# Patient Record
Sex: Female | Born: 1990 | Race: Black or African American | Hispanic: No | Marital: Single | Smoking: Never smoker
Health system: Southern US, Community
[De-identification: ages and names within clinical notes are randomized; demographics above are authoritative.]

## PROBLEM LIST (undated history)

## (undated) DIAGNOSIS — D649 Anemia, unspecified: Secondary | ICD-10-CM

## (undated) DIAGNOSIS — J45909 Unspecified asthma, uncomplicated: Secondary | ICD-10-CM

## (undated) DIAGNOSIS — D571 Sickle-cell disease without crisis: Secondary | ICD-10-CM

## (undated) DIAGNOSIS — I8390 Asymptomatic varicose veins of unspecified lower extremity: Secondary | ICD-10-CM

## (undated) HISTORY — DX: Unspecified asthma, uncomplicated: J45.909

## (undated) HISTORY — DX: Sickle-cell disease without crisis: D57.1

## (undated) HISTORY — DX: Anemia, unspecified: D64.9

## (undated) HISTORY — DX: Asymptomatic varicose veins of unspecified lower extremity: I83.90

## (undated) HISTORY — PX: INDUCED ABORTION: SHX677

---
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
## 2011-04-03 DIAGNOSIS — IMO0002 Reserved for concepts with insufficient information to code with codable children: Secondary | ICD-10-CM | POA: Insufficient documentation

## 2013-04-25 DIAGNOSIS — D582 Other hemoglobinopathies: Secondary | ICD-10-CM | POA: Insufficient documentation

## 2013-04-25 DIAGNOSIS — M21619 Bunion of unspecified foot: Secondary | ICD-10-CM | POA: Insufficient documentation

## 2013-06-07 DIAGNOSIS — Z975 Presence of (intrauterine) contraceptive device: Secondary | ICD-10-CM | POA: Insufficient documentation

## 2019-10-17 ENCOUNTER — Ambulatory Visit (LOCAL_COMMUNITY_HEALTH_CENTER): Payer: Medicaid Other | Admitting: Advanced Practice Midwife

## 2019-10-17 ENCOUNTER — Other Ambulatory Visit: Payer: Self-pay

## 2019-10-17 ENCOUNTER — Encounter: Payer: Self-pay | Admitting: Advanced Practice Midwife

## 2019-10-17 ENCOUNTER — Ambulatory Visit: Payer: Self-pay

## 2019-10-17 DIAGNOSIS — Z23 Encounter for immunization: Secondary | ICD-10-CM

## 2019-10-17 DIAGNOSIS — F339 Major depressive disorder, recurrent, unspecified: Secondary | ICD-10-CM | POA: Insufficient documentation

## 2019-10-17 DIAGNOSIS — T7412XS Child physical abuse, confirmed, sequela: Secondary | ICD-10-CM

## 2019-10-17 DIAGNOSIS — T7412XA Child physical abuse, confirmed, initial encounter: Secondary | ICD-10-CM | POA: Insufficient documentation

## 2019-10-17 DIAGNOSIS — Z113 Encounter for screening for infections with a predominantly sexual mode of transmission: Secondary | ICD-10-CM

## 2019-10-17 HISTORY — DX: Child physical abuse, confirmed, initial encounter: T74.12XA

## 2019-10-17 LAB — WET PREP FOR TRICH, YEAST, CLUE
Trichomonas Exam: NEGATIVE
Yeast Exam: NEGATIVE

## 2019-10-17 NOTE — Progress Notes (Signed)
Tdap adm. L. Delt-well tolerated; see FP visit Debera Lat, RN

## 2019-10-17 NOTE — Progress Notes (Signed)
In for screening due to vaginal itching x 5 days; desires HIV/RPR testing Debera Lat, RN  Wet prep reviewed-no Tx indicated; Tdap today Debera Lat, RN

## 2019-10-17 NOTE — Progress Notes (Signed)
[facility] Department STI clinic/screening visit  Subjective:  Beverly Dorsey is a 29 y.o.SBF G4P2 nonsmoker female being seen today for an STI screening visit. The patient reports they do have symptoms.  Patient reports that they do not desire a pregnancy in the next year.   They reported they are not interested in discussing contraception today.  No LMP recorded. (Menstrual status: IUD).   Patient has the following medical conditions:  There are no problems to display for this patient.   Chief Complaint  Patient presents with  . SEXUALLY TRANSMITTED DISEASE    HPI  Patient reports internal vaginal itching x 5 days.  LMP 8 years ago.  Last sex 10/12/19 without condom.  Mirena inserted 05/2017  See flowsheet for further details and programmatic requirements.    The following portions of the patient's history were reviewed and updated as appropriate: allergies, current medications, past medical history, past social history, past surgical history and problem list.  Objective:  There were no vitals filed for this visit.  Physical Exam Vitals and nursing note reviewed.  Constitutional:      Appearance: Normal appearance.  HENT:     Head: Normocephalic and atraumatic.     Mouth/Throat:     Mouth: Mucous membranes are moist.     Pharynx: Oropharynx is clear. No oropharyngeal exudate or posterior oropharyngeal erythema.  Eyes:     Conjunctiva/sclera: Conjunctivae normal.  Pulmonary:     Effort: Pulmonary effort is normal.  Abdominal:     Palpations: Abdomen is soft. There is no mass.     Tenderness: There is no abdominal tenderness. There is no rebound.  Genitourinary:    General: Normal vulva.     Exam position: Lithotomy position.     Pubic Area: No rash or pubic lice.      Labia:        Right: No rash or lesion.        Left: No rash or lesion.      Vagina: Vaginal discharge (white creamy moderate leukorrhea, ph>4.5) present. No erythema, bleeding or  lesions.     Cervix: Normal.     Uterus: Normal.      Adnexa: Right adnexa normal and left adnexa normal.     Rectum: Normal.  Musculoskeletal:     Cervical back: Neck supple.  Lymphadenopathy:     Head:     Right side of head: No preauricular or posterior auricular adenopathy.     Left side of head: No preauricular or posterior auricular adenopathy.     Cervical: No cervical adenopathy.     Upper Body:     Right upper body: No supraclavicular or axillary adenopathy.     Left upper body: No supraclavicular or axillary adenopathy.     Lower Body: No right inguinal adenopathy. No left inguinal adenopathy.  Skin:    General: Skin is warm and dry.     Findings: No rash.  Neurological:     Mental Status: She is alert and oriented to person, place, and time.      Assessment and Plan:  Beverly Dorsey is a 29 y.o. female presenting to the [facility] Department for STI screening  1. Screening examination for venereal disease Treat wet mount per standing orders Immunization nurse consult - HIV [place] [facility] - Syphilis Serology, [place] [facility] - Chlamydia/Gonorrhea  Lab - WET PREP FOR [place], YEAST, CLUE - Gonococcus culture     Return if symptoms worsen or  fail to improve.  No future appointments.  Herbie Saxon, CNM

## 2019-10-21 LAB — GONOCOCCUS CULTURE

## 2020-02-29 ENCOUNTER — Encounter: Payer: Self-pay | Admitting: Emergency Medicine

## 2020-02-29 ENCOUNTER — Emergency Department: Payer: Medicaid Other

## 2020-02-29 ENCOUNTER — Other Ambulatory Visit: Payer: Self-pay

## 2020-02-29 ENCOUNTER — Emergency Department
Admission: EM | Admit: 2020-02-29 | Discharge: 2020-02-29 | Disposition: A | Discharge status: Home / Self Care | Payer: Medicaid Other | Attending: Emergency Medicine | Admitting: Emergency Medicine

## 2020-02-29 DIAGNOSIS — N611 Abscess of the breast and nipple: Secondary | ICD-10-CM | POA: Diagnosis not present

## 2020-02-29 DIAGNOSIS — N644 Mastodynia: Secondary | ICD-10-CM | POA: Diagnosis present

## 2020-02-29 DIAGNOSIS — L0291 Cutaneous abscess, unspecified: Secondary | ICD-10-CM

## 2020-02-29 DIAGNOSIS — N63 Unspecified lump in unspecified breast: Secondary | ICD-10-CM | POA: Insufficient documentation

## 2020-02-29 NOTE — ED Triage Notes (Signed)
States she felt a knot to right breast area
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
## 2020-03-06 ENCOUNTER — Other Ambulatory Visit: Payer: Self-pay

## 2020-03-06 DIAGNOSIS — N6315 Unspecified lump in the right breast, overlapping quadrants: Secondary | ICD-10-CM

## 2020-03-09 ENCOUNTER — Other Ambulatory Visit: Payer: Medicaid Other

## 2020-03-16 ENCOUNTER — Ambulatory Visit
Admission: RE | Admit: 2020-03-16 | Discharge: 2020-03-16 | Disposition: A | Discharge status: Home / Self Care | Payer: Medicaid Other | Source: Ambulatory Visit

## 2020-03-16 ENCOUNTER — Other Ambulatory Visit: Payer: Self-pay

## 2020-03-16 DIAGNOSIS — N6315 Unspecified lump in the right breast, overlapping quadrants: Secondary | ICD-10-CM | POA: Insufficient documentation

## 2020-03-20 ENCOUNTER — Other Ambulatory Visit: Payer: Self-pay

## 2020-03-20 DIAGNOSIS — N6310 Unspecified lump in the right breast, unspecified quadrant: Secondary | ICD-10-CM

## 2020-03-20 DIAGNOSIS — R928 Other abnormal and inconclusive findings on diagnostic imaging of breast: Secondary | ICD-10-CM

## 2020-03-22 ENCOUNTER — Ambulatory Visit
Admission: RE | Admit: 2020-03-22 | Discharge: 2020-03-22 | Disposition: A | Discharge status: Home / Self Care | Payer: Medicaid Other | Source: Ambulatory Visit

## 2020-03-22 ENCOUNTER — Other Ambulatory Visit: Payer: Self-pay

## 2020-03-22 DIAGNOSIS — N6310 Unspecified lump in the right breast, unspecified quadrant: Secondary | ICD-10-CM

## 2020-03-22 DIAGNOSIS — R928 Other abnormal and inconclusive findings on diagnostic imaging of breast: Secondary | ICD-10-CM | POA: Diagnosis present

## 2020-03-23 LAB — SURGICAL PATHOLOGY

## 2020-03-26 ENCOUNTER — Telehealth: Payer: Self-pay

## 2020-03-26 NOTE — Telephone Encounter (Signed)
Beverly Dorsey has an appt with Dr. Celine Ahr on 05/01/2020 at 9:00 am.  Patient requested appt be set out one month.  Patient is aware of time/date.

## 2020-05-01 ENCOUNTER — Encounter: Payer: Self-pay | Admitting: General Surgery

## 2020-05-01 ENCOUNTER — Other Ambulatory Visit: Payer: Self-pay

## 2020-05-01 ENCOUNTER — Ambulatory Visit (INDEPENDENT_AMBULATORY_CARE_PROVIDER_SITE_OTHER): Payer: Medicaid Other | Admitting: General Surgery

## 2020-05-01 VITALS — BP 121/87 | HR 87 | Temp 98.3°F | Ht 64.0 in | Wt 160.6 lb

## 2020-05-01 DIAGNOSIS — D249 Benign neoplasm of unspecified breast: Secondary | ICD-10-CM

## 2020-05-01 DIAGNOSIS — M2010 Hallux valgus (acquired), unspecified foot: Secondary | ICD-10-CM | POA: Insufficient documentation

## 2020-05-01 NOTE — Patient Instructions (Addendum)
Dr Celine Ahr discussed with patient the surgical option and risk factors of the surgery. Dr Celine Ahr discussed with patient that she can give our office a call when she is ready to proceed with surgery. Follow-up with our office as needed. Please call and ask to speak with a nurse if you develop questions or concerns.  Fibroadenoma A fibroadenoma is a lump (tumor) in the breast. The lump is benign. This means that it is not cancer. It may move under your skin when you touch it. This kind of lump can grow in one breast or in both breasts. The cause of this condition is not known. Some of the lumps are too small to be felt. Others can be felt as firm, round, smooth, or movable lumps. This kind of lump can be treated with regular breast exams. Exams are done to check for changes in the tumor. In some cases, the tumor is removed. The tumor can be removed if:  It is large.  It continues to grow.  It is causing pain or changes in the skin of the breast.  The patient is an adolescent girl. Lumps in young girls tend to grow over time. Follow these instructions at home: Breast exams   Check your breasts at home as told by your doctor. Report any changes or concerns. Check for the following: ? The size of the tumor. ? The look and feel of the skin of your breasts. ? The look and feel of your nipples. General instructions  If you had a lump removed, follow instructions from your doctor for home care after surgery.  Do not use any products that contain nicotine or tobacco, such as cigarettes and e-cigarettes. These can further increase your cancer risk. If you need help quitting, ask your doctor.  Keep all follow-up visits as told by your doctor. This is important. You will need breast exams on a regular basis. Contact a doctor if:  The lump changes in size or feels different.  The lump starts to be painful.  You find a new lump.  You have any changes in the skin that covers your  breast.  You have any changes in your nipple.  You have fluid leaking from your nipple.  You have redness around your nipple. Summary  A fibroadenoma is a lump (tumor) in the breast. The lump is benign. This means that it is not cancer.  This tumor may feel like a firm, round, smooth, and movable lump in your breast. Some fibroadenomas are too small to be felt.  Having this condition may slightly increase your risk for developing breast cancer in the future.  Do breast exams at home. Watch for changes in the size of the lump. Also, watch for changes in the look and feel of your nipples and the skin of your breast.  Contact your doctor if the lump grows bigger or starts to cause pain. Also, let your doctor know if there is a change in the way your nipples look and in the feel of the skin of your breast. This information is not intended to replace advice given to you by your health care provider. Make sure you discuss any questions you have with your health care provider. Document Revised: 06/01/2017 Document Reviewed: 06/01/2017 Elsevier Patient Education  2020 [employer].

## 2020-05-01 NOTE — Progress Notes (Signed)
Patient ID: Beverly Dorsey, female   DOB: 26-Jan-1991, 29 y.o.   MRN: [id number]  Chief Complaint  Patient presents with  . New Patient (Initial Visit)    new pt ref [place] Fibroadenoma    HPI Beverly Dorsey is a 29 y.o. female.   She has been referred by the [facility] breast center for further evaluation of a right breast fibroadenoma.  She reports that she first noticed the area roughly a year ago, but does not routinely perform breast self exams.  She came to the emergency department at [facility] for evaluation and an ultrasound was performed that appeared to be consistent with a fibroadenoma.  Subsequent imaging and biopsy were consistent with this diagnosis.  She had expressed some concern regarding the mass and desired surgical consultation.  She endorses menarche at age 75.  She has had 4 pregnancies and did breast-feed.  Age of first pregnancy was 22.  She is currently using a Mirena IUD for birth control.  She reports that her maternal grandmother did have a tumor of some type in her breast, but she does not know if it was cancer.  She denies any significant skin changes.  No nipple discharge.  She does state that she can feel the lump and reports some mild breast tenderness.   Past Medical History:  Diagnosis Date  . Anemia   . Asthma   . Sickle cell anemia (HCC)     Past Surgical History:  Procedure Laterality Date  . WISDOM TOOTH EXTRACTION  2007    Family History  Problem Relation Age of Onset  . Hypertension Mother     Social History Social History   Tobacco Use  . Smoking status: Never Smoker  . Smokeless tobacco: Never Used  Substance Use Topics  . Alcohol use: Yes  . Drug use: Not on file    No Known Allergies  Current Outpatient Medications  Medication Sig Dispense Refill  . Influenza Vac Subunit Quad (FLUCELVAX QUADRIVALENT IM) Flucelvax Quad 2018-2019 (PF) 60 mcg (15 mcg x 4)/0.5 mL IM syringe  TO BE ADMINISTERED BY PHARMACIST FOR IMMUNIZATION     No  current facility-administered medications for this visit.    Review of Systems Review of Systems  All other systems reviewed and are negative. Or as discussed in the history of present illness.  Blood pressure 121/87, pulse 87, temperature 98.3 F (36.8 C), temperature source Oral, height 5\' 4"  (1.626 m), weight 160 lb 9.6 oz (72.8 kg), SpO2 98 %.  Physical Exam Physical Exam Vitals reviewed. Exam conducted with a chaperone present.  Constitutional:      General: She is not in acute distress.    Appearance: She is normal weight.  HENT:     Head: Normocephalic and atraumatic.     Nose:     Comments: Covered with a mask    Mouth/Throat:     Comments: Covered with a mask Eyes:     General: No scleral icterus.       Right eye: No discharge.        Left eye: No discharge.  Neck:     Comments: No thyromegaly or dominant thyroid masses appreciated. Cardiovascular:     Rate and Rhythm: Normal rate and regular rhythm.     Heart sounds: No murmur heard.   Pulmonary:     Effort: Pulmonary effort is normal. No respiratory distress.     Breath sounds: Normal breath sounds.  Chest:     Breasts: Breasts are symmetrical.  Left: Normal.       Comments: Dense breast tissue bilaterally.  There is a smooth, mobile, well-circumscribed mass at roughly the 12 o'clock position about 4 cm from the nipple, just off the chest wall.  It is approximately 3 cm in greatest dimension. Abdominal:     General: Bowel sounds are normal.     Palpations: Abdomen is soft.  Genitourinary:    Comments: Deferred Musculoskeletal:        General: No swelling or tenderness.  Lymphadenopathy:     Cervical: No cervical adenopathy.  Skin:    General: Skin is warm and dry.  Neurological:     General: No focal deficit present.     Mental Status: She is alert and oriented to person, place, and time.  Psychiatric:        Mood and Affect: Mood normal.        Behavior: Behavior normal.     Data  Reviewed I reviewed the imaging performed, including breast ultrasound and mammography.  I concur with the radiology interpretations which are copied here:  CLINICAL DATA:  Palpable mass in the RIGHT breast for 6 months. Patient reports no associated pain or redness. No family history of cancer.  EXAM: ULTRASOUND OF THE RIGHT BREAST  COMPARISON:  None.  FINDINGS: Targeted ultrasound is performed, showing a circumscribed oval hypoechoic mass in the 1 o'clock location of the RIGHT breast 5 centimeters from the nipple measuring 3.1 x 2.1 x 2.4 centimeters. Internal blood flow is present.  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  This examination was performed on an emergent/urgent basis to answer a single clinical question and does not constitute a full diagnostic work-up. A full diagnostic work-up at a facility that provides diagnostic breast imaging is recommended to include diagnostic mammography and/or repeat breast ultrasound as soon as possible.  RECOMMENDATION: Recommend bilateral diagnostic mammogram and RIGHT breast ultrasound at a dedicated breast imaging facility for further evaluation and discussion of management options.  I have discussed the findings and recommendations with the patient. If applicable, a reminder letter will be sent to the patient regarding the next appointment.  BI-RADS CATEGORY  3: Probably benign.  CLINICAL DATA:  Patient presents for evaluation of palpable abnormality within the right breast.  EXAM: DIGITAL DIAGNOSTIC BILATERAL MAMMOGRAM WITH CAD AND TOMO  ULTRASOUND RIGHT BREAST  COMPARISON:  None.  Previous ultrasound was reviewed.  ACR Breast Density Category c: The breast tissue is heterogeneously dense, which may obscure small masses.  FINDINGS: Within the breast bilaterally there are small oval circumscribed masses most compatible with benign process  given multiplicity. Additionally, underlying the palpable marker within the right breast is a large lobular mass. No additional suspicious findings within the right or left breast.  Mammographic images were processed with CAD.  Targeted ultrasound is performed, showing a 2.2 x 3.0 x 2.3 cm lobular hypoechoic right breast mass 12 o'clock position 4 cm from nipple.  No right axillary adenopathy.  IMPRESSION: Indeterminate palpable right breast mass 12 o'clock position.  RECOMMENDATION: Ultrasound-guided core needle biopsy palpable right breast mass.  I have discussed the findings and recommendations with the patient. If applicable, a reminder letter will be sent to the patient regarding the next appointment.  BI-RADS CATEGORY  4: Suspicious.  I also reviewed the pathology which I agree is concordant with the physical exam findings and the imaging that was performed.  The pathology report is copied here:  SURGICAL PATHOLOGY  CASE: [id number]  PATIENT: Gaige Racz  Surgical Pathology Report      Specimen Submitted:  A. Right breast 12:00 4 cm fn; US biopsy   Clinical History: Vision clip within 3 cm mass UOQ Right breast       DIAGNOSIS:  A. 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  - NEGATIVE FOR ATYPIA AND MALIGNANCY.   Assessment This is a 29 year old woman with a dominant right breast mass.  It has been biopsied and is consistent with a benign fibroadenoma.  Plan I discussed with her the option of surgery versus observation.  The mass is benign and is unlikely to undergo any kind of malignant transformation.  She is concerned about potential cosmetic defects if she has the mass excised.  I did discuss with her some of the plastics techniques that can be used to avoid a significant defect at the time of surgery, as well as reconstructive options that could potentially  be used, should there be a large defect.  Based upon the patient's breast volume and the small size of the mass, I told her I did not think this was likely to be an issue, however.  I also reassured her that she did not need to undergo urgent excision at this time and could certainly wait and think about it.  She felt like this would be her preferred option and she will contact me if she decides to proceed with operative intervention.  She should continue breast self exams and at an appropriate age, begin routine screening mammography.    Fredirick Maudlin 05/01/2020, 9:55 AM

## 2020-11-23 IMAGING — US US BREAST*R* LIMITED INC AXILLA
1 series · 13 of 14 positions shown · non-contrast
Comparison: None.

CLINICAL DATA: Patient presents for evaluation of palpable
abnormality within the right breast.

EXAM:
DIGITAL DIAGNOSTIC BILATERAL MAMMOGRAM WITH CAD AND TOMO
ULTRASOUND RIGHT BREAST

[Series 1: us breast*right* limited inc axilla · 0.08mm/px · 13 of 14 slices shown]
[im 1/14]
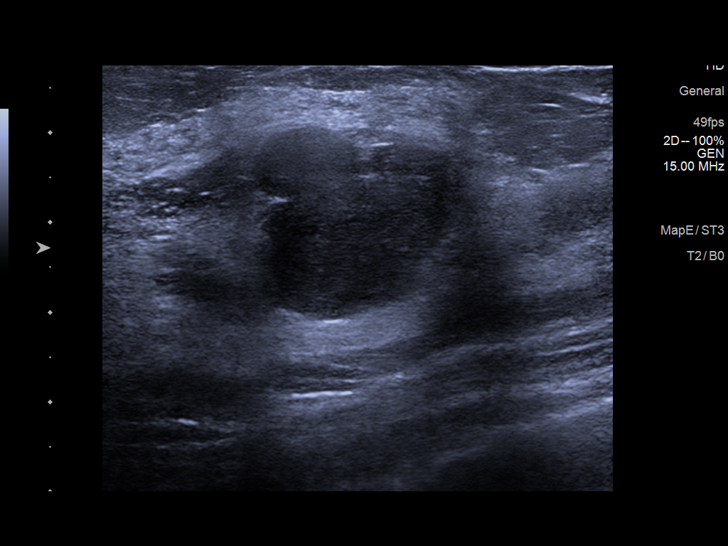
[im 2/14]
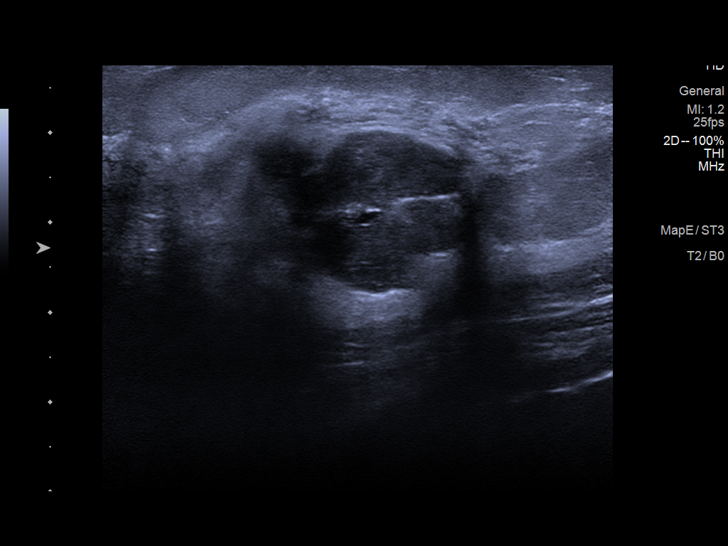
[im 3/14]
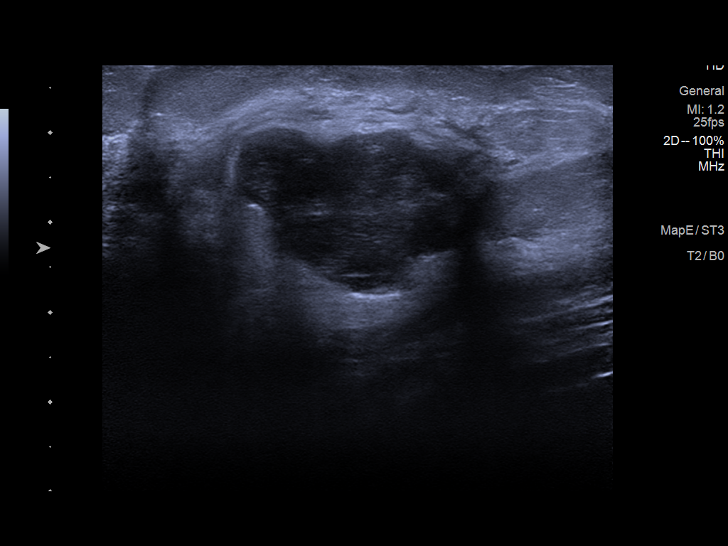
[im 4/14]
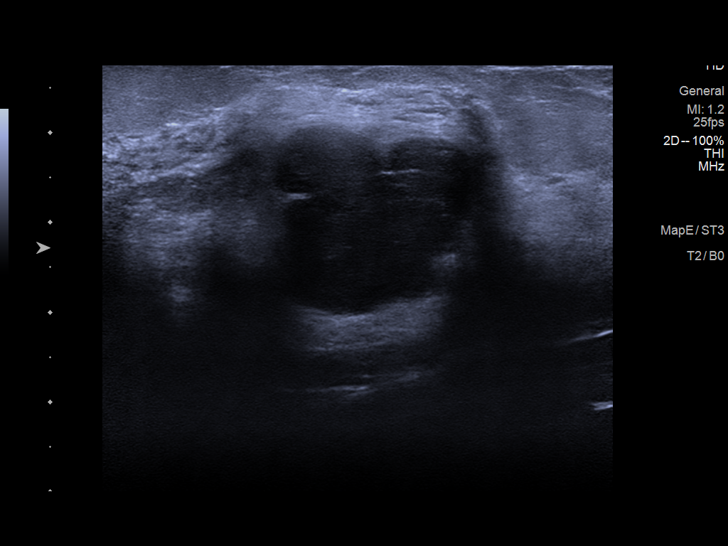
[im 5/14]
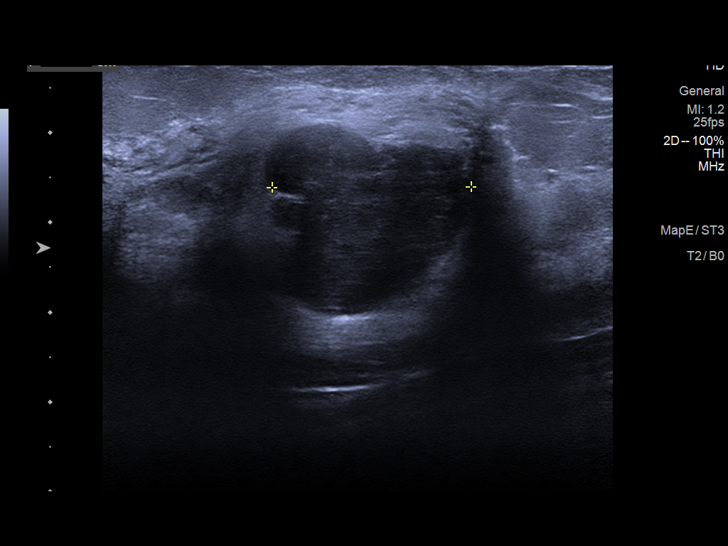
[im 6/14]
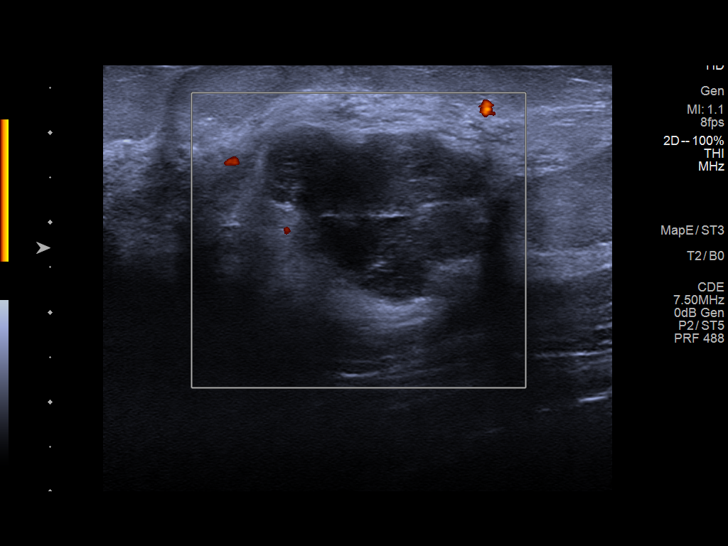
[im 8/14]
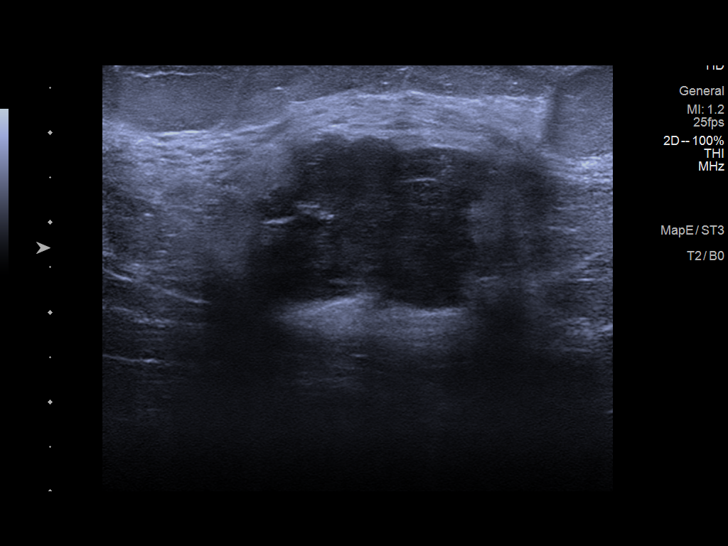
[im 9/14]
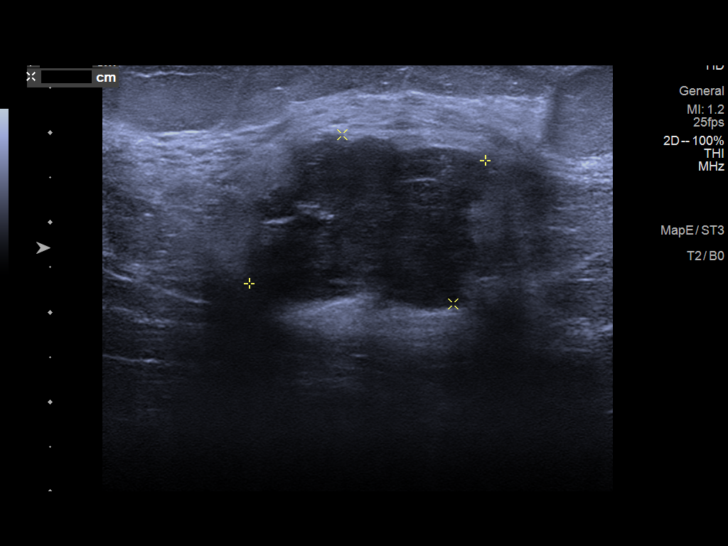
[im 10/14]
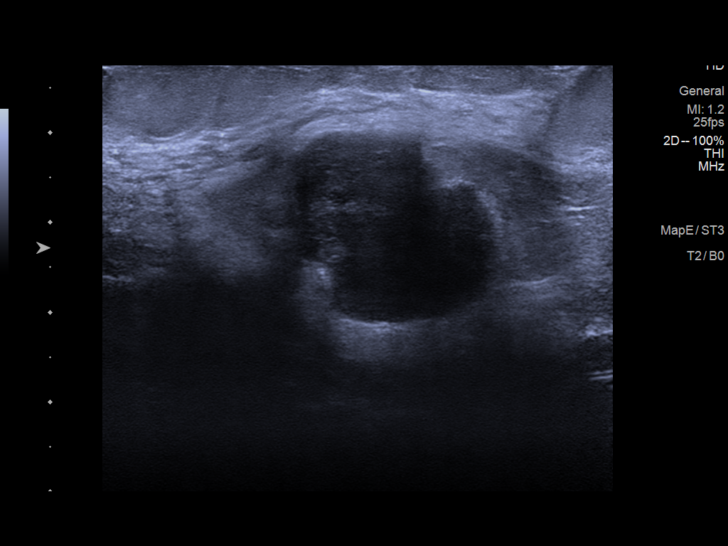
[im 11/14]
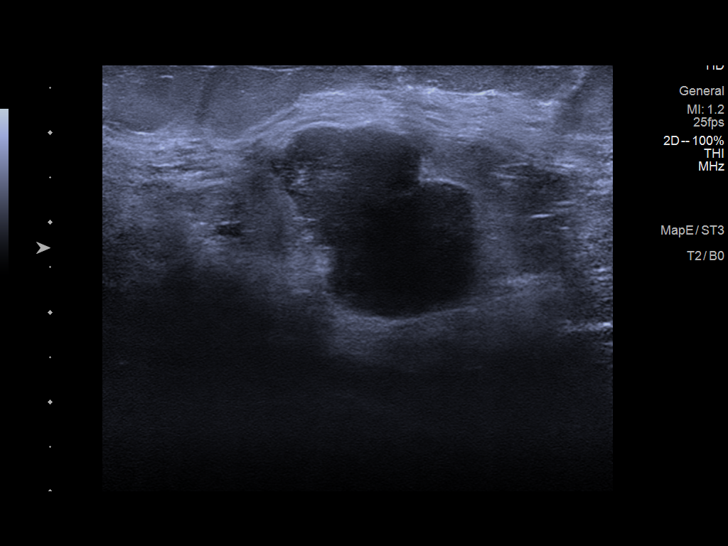
[im 12/14]
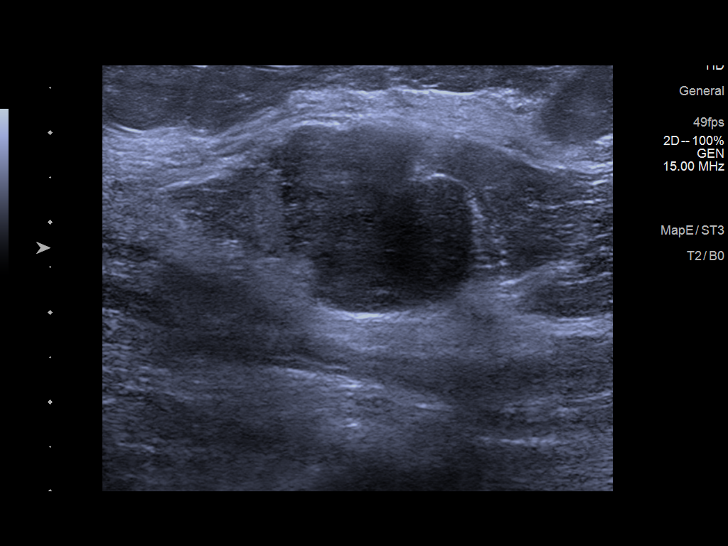
[im 13/14]
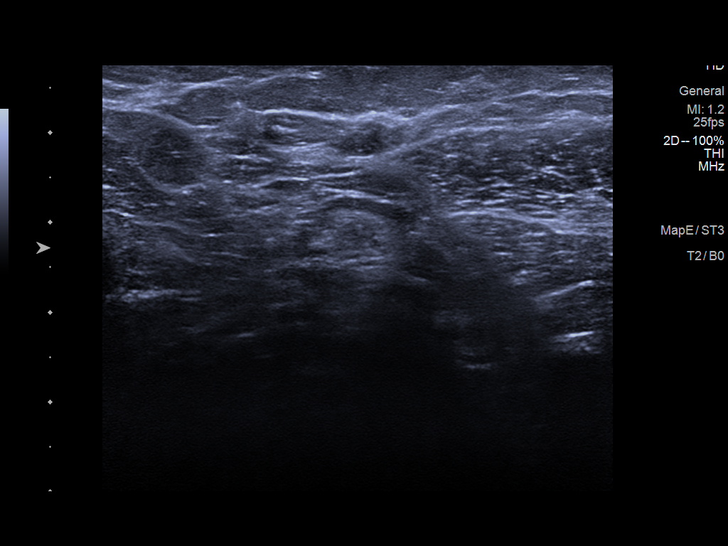
[im 14/14]
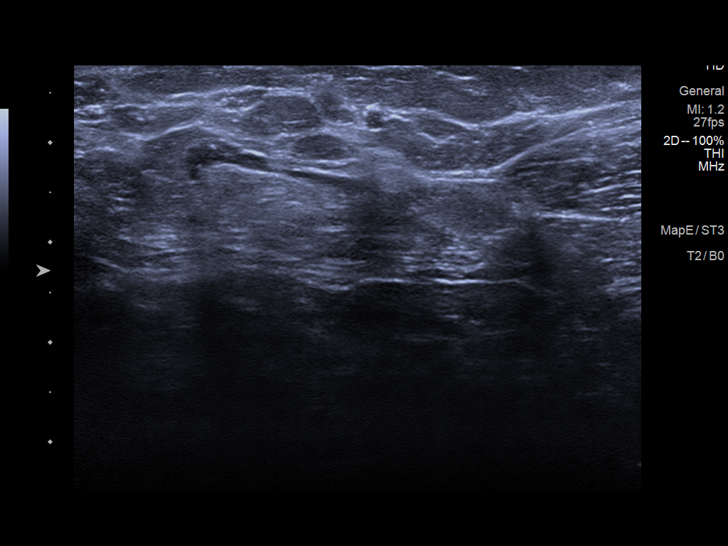

[13 of 14 positions shown; findings below may reference images not displayed]

Previous ultrasound was reviewed.

ACR Breast Density Category c: The breast tissue is heterogeneously
dense, which may obscure small masses.
FINDINGS: Within the breast bilaterally there are small oval circumscribed
masses most compatible with benign process given multiplicity.
Additionally, underlying the palpable marker within the right breast
is a large lobular mass. No additional suspicious findings within
the right or left breast.

Mammographic images were processed with CAD.

Targeted ultrasound is performed, showing a 2.2 x 3.0 x 2.3 cm
lobular hypoechoic right breast mass 12 o'clock position 4 cm from
nipple.

No right axillary adenopathy.
IMPRESSION: Indeterminate palpable right breast mass 12 o'clock position.

RECOMMENDATION:
Ultrasound-guided core needle biopsy palpable right breast mass.

I have discussed the findings and recommendations with the patient.
If applicable, a reminder letter will be sent to the patient
regarding the next appointment.

BI-RADS CATEGORY  4: Suspicious.

## 2020-11-29 IMAGING — MG US  BREAST BX W/ LOC DEV 1ST LESION IMG BX SPEC US GUIDE*R*
1 series · 8 of 8 positions shown · non-contrast
Comparison: Previous exam(s).
COMPARISON: Previous exam(s).

Addendum:
CLINICAL DATA: 29-year-old female for tissue sampling of 3 cm UPPER
RIGHT breast mass

EXAM:
ULTRASOUND GUIDED RIGHT BREAST CORE NEEDLE BIOPSY

[Series 1: MG view · 0.07mm/px · 8 of 10 slices shown]
[im 1/10]
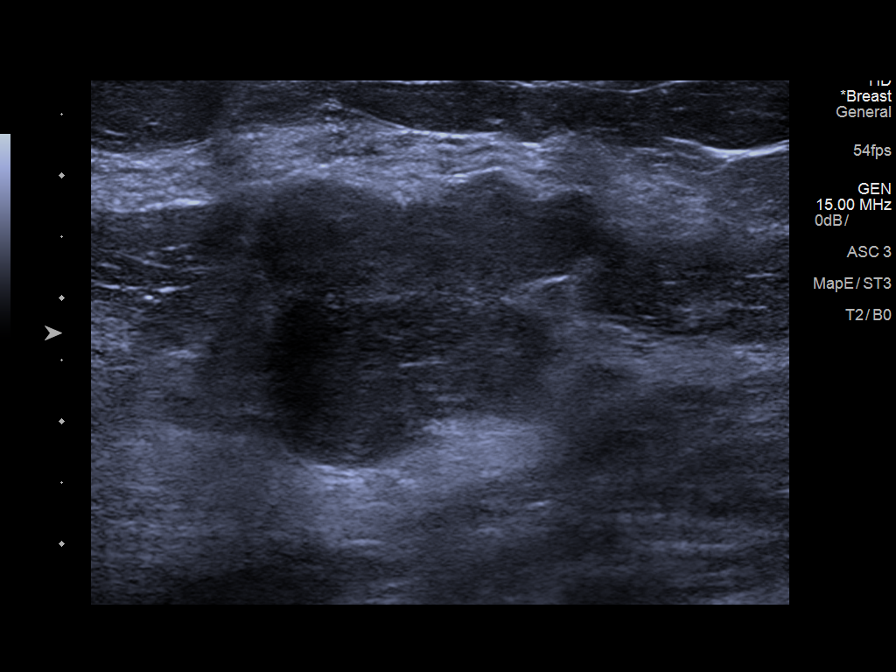
[im 2/10]
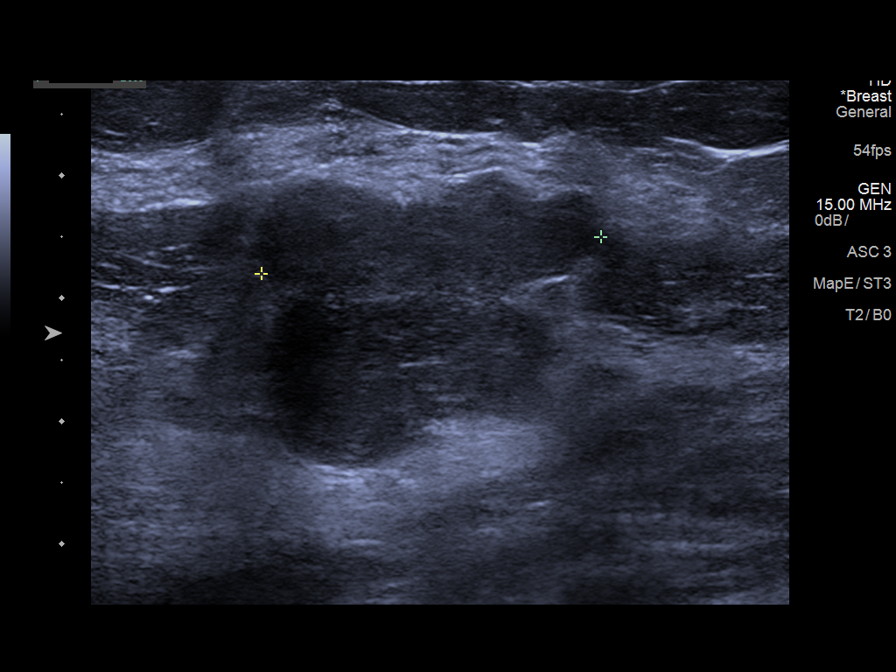
[im 3/10]
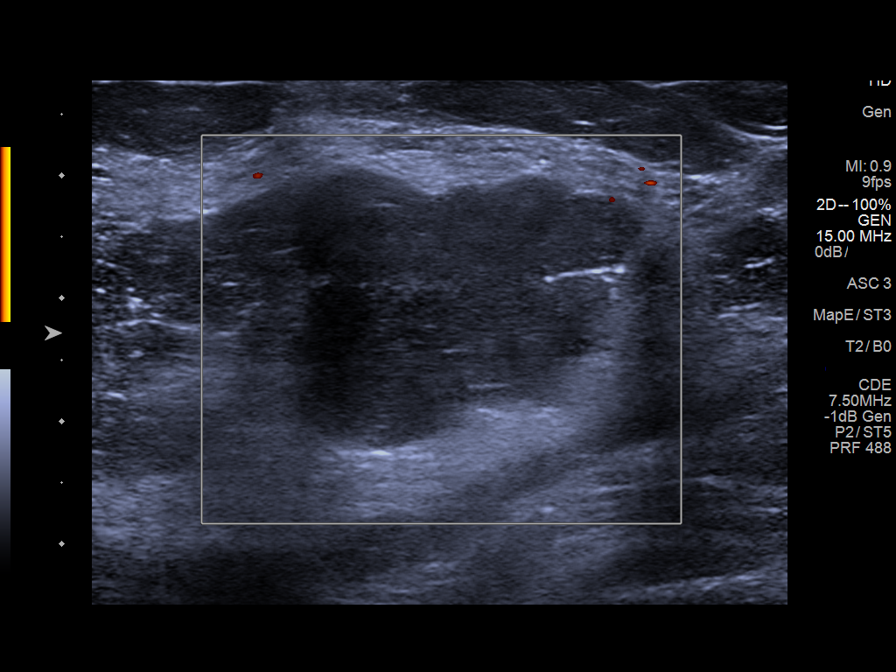
[im 4/10]
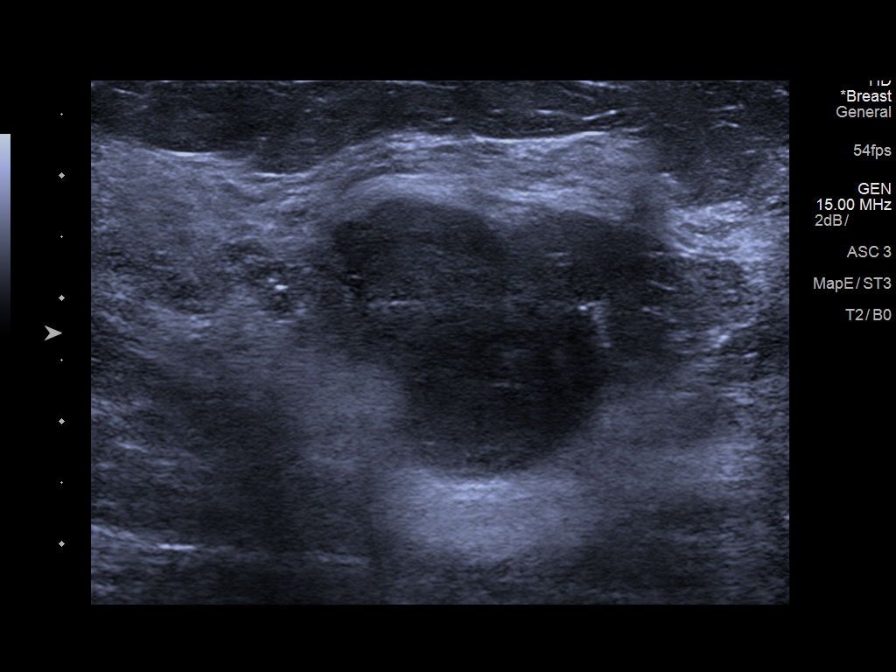
[im 6/10]
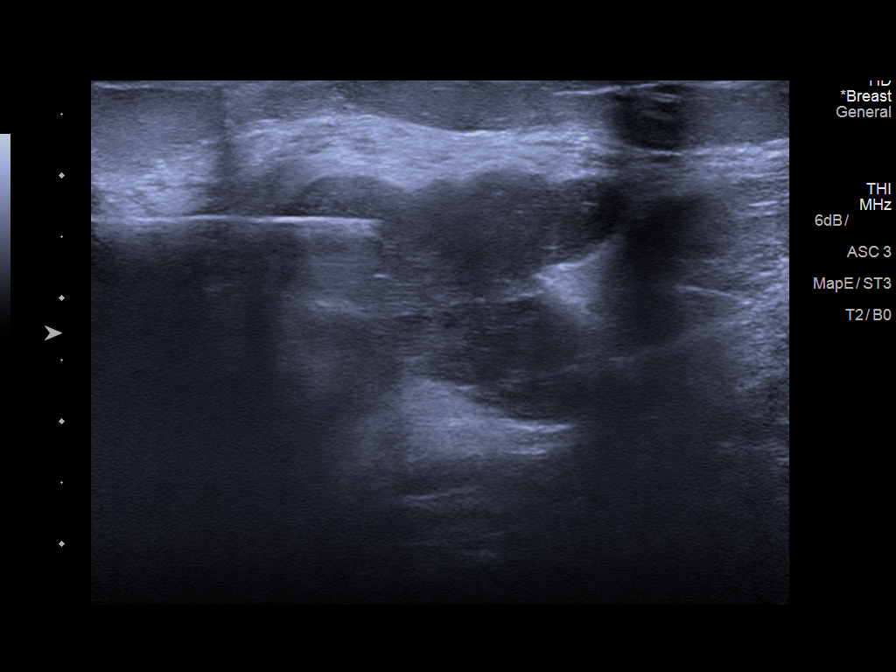
[im 7/10]
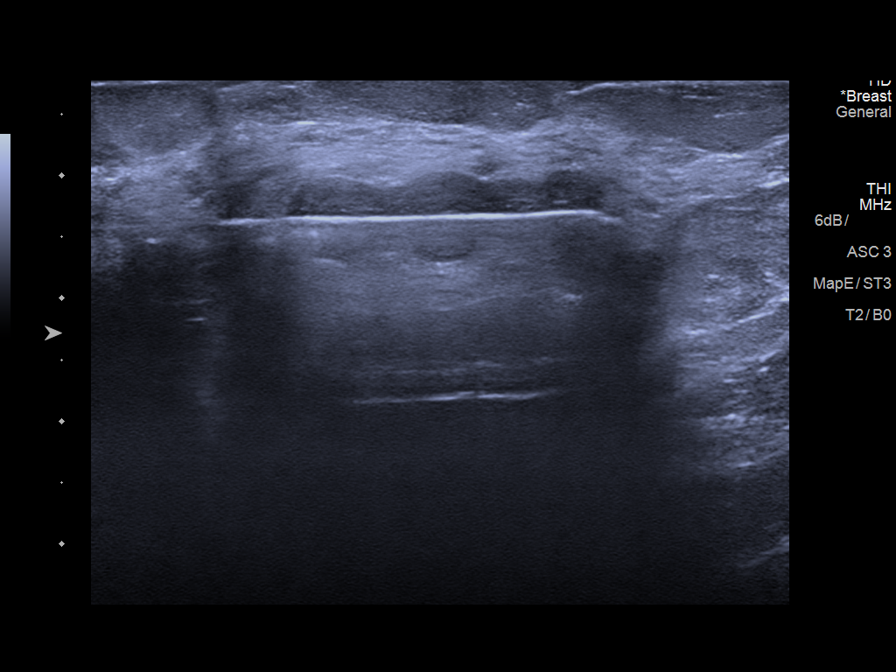
[im 8/10]
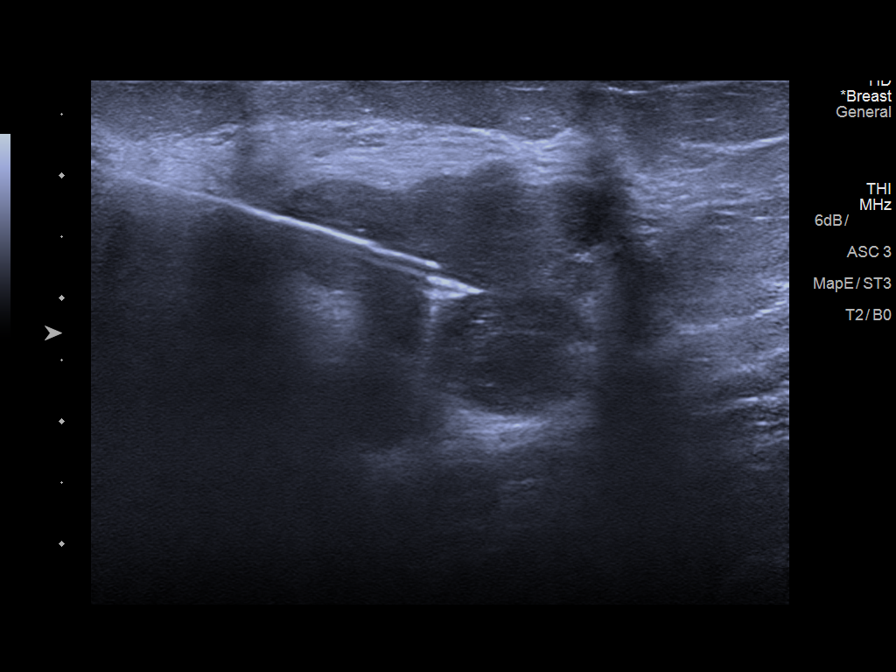
[im 10/10]
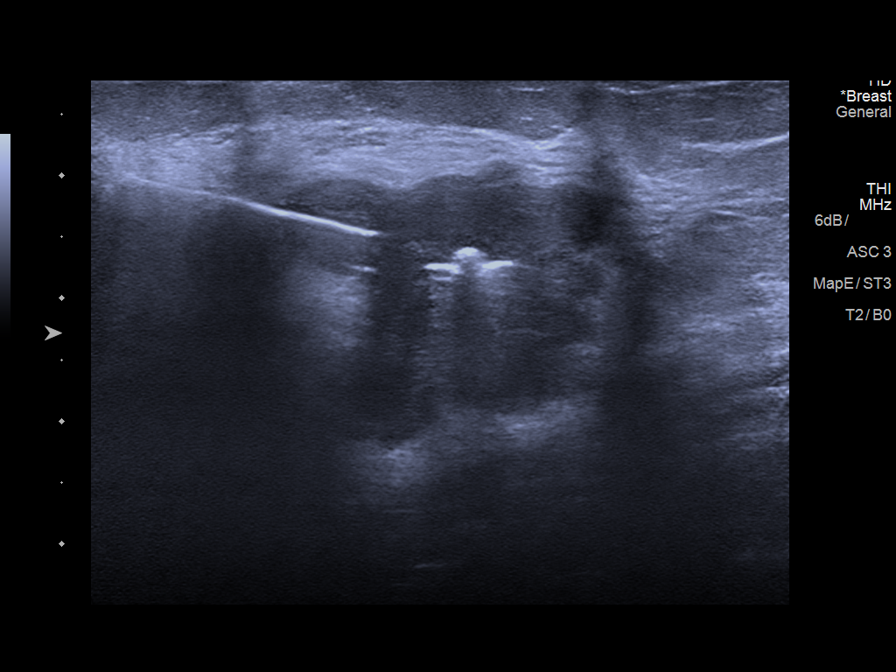

[8 of 8 positions shown; findings below may reference images not displayed]



Using sterile technique and 1% Lidocaine as local anesthetic, under
direct ultrasound visualization, a 12 gauge Girgis device was
used to perform biopsy of the 3 cm mass at the 12 to 1 o'clock
position of the RIGHT breast using a LATERAL approach.

At the conclusion of the procedure a Vision tissue marker clip was
deployed into the biopsy cavity, with satisfactory placement
confirmed sonographically. Due to the large size of this mass and
confirmed satisfactory placement, post biopsy mammogram was not
performed.
IMPRESSION: Ultrasound guided biopsy of 3 cm UPPER RIGHT breast mass.
Satisfactory placement of Vision biopsy clip within the mass. No
apparent complications.

ADDENDUM:
PATHOLOGY revealed: A. RIGHT BREAST, [DATE] 4 CMFN; ULTRASOUND-GUIDED
BIOPSY: - FIBROADENOMA WITH USUAL DUCTAL HYPERPLASIA AND APOCRINE
METAPLASIA. - NEGATIVE FOR ATYPIA AND MALIGNANCY.

Pathology results are CONCORDANT with imaging findings, per Dr. Kirakoya
Sambo.

Pathology results and recommendations below were discussed with
patient by telephone on 03/23/2020. Patient reported biopsy site
within normal limits with slight tenderness at the site. Post biopsy
care instructions were reviewed, questions were answered and my
direct phone number was provided to patient. Patient was instructed
to call [HOSPITAL] if any concerns or questions arise
related to the biopsy.

Recommendation: Patient instructed to continue with monthly self
breast examinations, clinical follow-up as needed, and begin annual
bilateral screening mammogram at age 38. *Please note: Patient
requested surgical consultation for removal of benign fibroadenoma.
Request for surgical consultation relayed to Reiko Marian RT at
[HOSPITAL] by Hiyani Danguir RN on 03/23/2020.

Pathology results reported by Hiyani Danguir RN on 03/23/2020.



Using sterile technique and 1% Lidocaine as local anesthetic, under
direct ultrasound visualization, a 12 gauge Girgis device was
used to perform biopsy of the 3 cm mass at the 12 to 1 o'clock
position of the RIGHT breast using a LATERAL approach.

At the conclusion of the procedure a Vision tissue marker clip was
deployed into the biopsy cavity, with satisfactory placement
confirmed sonographically. Due to the large size of this mass and
confirmed satisfactory placement, post biopsy mammogram was not
performed.
IMPRESSION: Ultrasound guided biopsy of 3 cm UPPER RIGHT breast mass.
Satisfactory placement of Vision biopsy clip within the mass. No
apparent complications.

## 2021-01-21 ENCOUNTER — Ambulatory Visit: Payer: Medicaid Other

## 2021-01-21 ENCOUNTER — Other Ambulatory Visit: Payer: Self-pay

## 2021-01-23 ENCOUNTER — Ambulatory Visit (LOCAL_COMMUNITY_HEALTH_CENTER): Payer: Medicaid Other | Admitting: Advanced Practice Midwife

## 2021-01-23 ENCOUNTER — Other Ambulatory Visit: Payer: Self-pay

## 2021-01-23 ENCOUNTER — Encounter: Payer: Self-pay | Admitting: Advanced Practice Midwife

## 2021-01-23 VITALS — BP 108/65 | Ht 64.5 in | Wt 148.8 lb

## 2021-01-23 DIAGNOSIS — Z3009 Encounter for other general counseling and advice on contraception: Secondary | ICD-10-CM | POA: Diagnosis not present

## 2021-01-23 DIAGNOSIS — E663 Overweight: Secondary | ICD-10-CM

## 2021-01-23 DIAGNOSIS — Z30432 Encounter for removal of intrauterine contraceptive device: Secondary | ICD-10-CM | POA: Diagnosis not present

## 2021-01-23 DIAGNOSIS — J45909 Unspecified asthma, uncomplicated: Secondary | ICD-10-CM

## 2021-01-23 DIAGNOSIS — Z30013 Encounter for initial prescription of injectable contraceptive: Secondary | ICD-10-CM | POA: Diagnosis not present

## 2021-01-23 DIAGNOSIS — Z975 Presence of (intrauterine) contraceptive device: Secondary | ICD-10-CM

## 2021-01-23 DIAGNOSIS — D241 Benign neoplasm of right breast: Secondary | ICD-10-CM | POA: Insufficient documentation

## 2021-01-23 LAB — HM HIV SCREENING LAB: HM HIV Screening: NEGATIVE

## 2021-01-23 MED ORDER — MEDROXYPROGESTERONE ACETATE 150 MG/ML IM SUSP
150.0000 mg | INTRAMUSCULAR | Status: AC
  Administered 2021-01-23 – 2021-09-03 (×3): 150 mg via INTRAMUSCULAR
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
## 2021-01-23 NOTE — Progress Notes (Signed)
Patient here for PE and IUD removal, states she has had her IUD for about 3 1/2 years and wants to switch to Depo for [facility]. States she is having some insomnia with IUD (Mirena).Jenetta Downer, RN

## 2021-01-23 NOTE — Progress Notes (Signed)
PT negative, Depo given, tolerated well, next depo card given. [place] referral faxed with confirmation. Patient aware to expect call from [facility].Jenetta Downer, RN

## 2021-01-24 LAB — WET PREP FOR TRICH, YEAST, CLUE
Clue Cell Exam: NEGATIVE
Trichomonas Exam: NEGATIVE
Yeast Exam: NEGATIVE

## 2021-01-24 LAB — PREGNANCY, URINE: Preg Test, Ur: NEGATIVE

## 2021-01-26 LAB — IGP, APTIMA HPV
HPV Aptima: NEGATIVE
PAP Smear Comment: 0

## 2021-02-20 ENCOUNTER — Encounter: Payer: Self-pay | Admitting: General Surgery

## 2021-04-23 ENCOUNTER — Other Ambulatory Visit: Payer: Self-pay

## 2021-04-23 ENCOUNTER — Ambulatory Visit (LOCAL_COMMUNITY_HEALTH_CENTER): Payer: Medicaid Other

## 2021-04-23 VITALS — BP 104/70 | Ht 64.5 in | Wt 143.5 lb

## 2021-04-23 DIAGNOSIS — Z3009 Encounter for other general counseling and advice on contraception: Secondary | ICD-10-CM | POA: Diagnosis not present

## 2021-04-23 DIAGNOSIS — Z30013 Encounter for initial prescription of injectable contraceptive: Secondary | ICD-10-CM

## 2021-04-23 DIAGNOSIS — Z3042 Encounter for surveillance of injectable contraceptive: Secondary | ICD-10-CM

## 2021-04-23 NOTE — Progress Notes (Signed)
12 weeks 6 days post depo. Voices no concerns. Depo consent signed today. Depo given per order by Ola Spurr, CNM dated 01/23/21. Tolerated well LUOQ. Next depo due 07/09/2021, has reminder. Josie Saunders, RN

## 2021-09-03 ENCOUNTER — Ambulatory Visit (LOCAL_COMMUNITY_HEALTH_CENTER): Payer: Medicaid Other | Admitting: Family Medicine

## 2021-09-03 DIAGNOSIS — Z3202 Encounter for pregnancy test, result negative: Secondary | ICD-10-CM | POA: Diagnosis not present

## 2021-09-03 DIAGNOSIS — Z30012 Encounter for prescription of emergency contraception: Secondary | ICD-10-CM | POA: Diagnosis not present

## 2021-09-03 DIAGNOSIS — Z3042 Encounter for surveillance of injectable contraceptive: Secondary | ICD-10-CM

## 2021-09-03 DIAGNOSIS — Z30013 Encounter for initial prescription of injectable contraceptive: Secondary | ICD-10-CM | POA: Diagnosis not present

## 2021-09-03 DIAGNOSIS — Z309 Encounter for contraceptive management, unspecified: Secondary | ICD-10-CM

## 2021-09-03 DIAGNOSIS — Z113 Encounter for screening for infections with a predominantly sexual mode of transmission: Secondary | ICD-10-CM

## 2021-09-03 LAB — WET PREP FOR TRICH, YEAST, CLUE
Trichomonas Exam: NEGATIVE
Yeast Exam: NEGATIVE

## 2021-09-03 LAB — PREGNANCY, URINE: Preg Test, Ur: NEGATIVE

## 2021-09-03 LAB — HM HIV SCREENING LAB: HM HIV Screening: NEGATIVE

## 2021-09-03 MED ORDER — LEVONORGESTREL 1.5 MG PO TABS: 1.5000 mg | ORAL_TABLET | Freq: Once | ORAL | 0 refills | Status: AC

## 2021-09-03 NOTE — Progress Notes (Signed)
Pt here to restart her Depo. Asking also for STD screening. Wet Prep negative. UPT negative. Orders completed per providers orders and Plan B sent to pharmacy.  ?

## 2021-09-04 NOTE — Progress Notes (Signed)
? ?[place] problem visit  ?[place] Department ? ?Subjective:  ?Beverly Dorsey is a 31 y.o. being seen today for  ? ?Chief Complaint  ?Patient presents with  ? Contraception  ?  Late Depo. 19 weeks overdue  ? ? ?Pt presents for late Depo.  Last depo was 19 weeks ago.  Last sex was 2 weeks ago without condom.  Pt requesting STI screening, denies s/sx  ? ? ? ?Does the patient have a current or past history of drug use? No  ? No components found for: HCV] ? ? ?Health Maintenance Due  ?Topic Date Due  ? Hepatitis C Screening  Never done  ? ? ?ROS ? ?The following portions of the patient's history were reviewed and updated as appropriate: allergies, current medications, past family history, past medical history, past social history, past surgical history and problem list. Problem list updated. ? ? ?See flowsheet for other program required questions. ? ?Objective:  ?There were no vitals filed for this visit. ? ?Physical Exam ?Vitals and nursing note reviewed.  ?Constitutional:   ?   Appearance: Normal appearance.  ?HENT:  ?   Head: Normocephalic and atraumatic.  ?   Mouth/Throat:  ?   Mouth: Mucous membranes are moist.  ?   Pharynx: Oropharynx is clear. No oropharyngeal exudate or posterior oropharyngeal erythema.  ?Pulmonary:  ?   Effort: Pulmonary effort is normal.  ?Abdominal:  ?   General: Abdomen is flat.  ?   Palpations: There is no mass.  ?   Tenderness: There is no abdominal tenderness. There is no rebound.  ?Genitourinary: ?   Exam position: Lithotomy position.  ?   Pubic Area: No rash or pubic lice.   ?   Labia:     ?   Right: No rash or lesion.     ?   Left: No rash or lesion.   ?   Vagina: Normal. No vaginal discharge, erythema, bleeding or lesions.  ?   Cervix: No cervical motion tenderness, discharge, friability, lesion or erythema.  ?   Uterus: Normal.   ?   Adnexa: Right adnexa normal and left adnexa normal.  ?   Comments: Deferred - pt self collect  ?Lymphadenopathy:  ?    Head:  ?   Right side of head: No preauricular or posterior auricular adenopathy.  ?   Left side of head: No preauricular or posterior auricular adenopathy.  ?   Cervical: No cervical adenopathy.  ?   Upper Body:  ?   Right upper body: No supraclavicular or axillary adenopathy.  ?   Left upper body: No supraclavicular or axillary adenopathy.  ?   Lower Body: No right inguinal adenopathy. No left inguinal adenopathy.  ?Skin: ?   General: Skin is warm and dry.  ?   Findings: No rash.  ?Neurological:  ?   Mental Status: She is alert and oriented to person, place, and time.  ? ? ? ? ?Assessment and Plan:  ?Beverly Dorsey is a 31 y.o. female presenting to the [facility] Department for a Women's Health problem visit ? ?1. Screening examination for venereal disease ?Patient accepted all screenings including wet prep, vaginal CT/GC and bloodwork for HIV/RPR.  ?Patient meets criteria for HepB screening? No. Ordered? No -   ?Patient meets criteria for HepC screening? No. Ordered? No -   ? ?Wet prep results neg    ?No Treatment needed  ?Discussed time line for [facility] results  and that patient will be called with positive results and encouraged patient to call if she had not heard in 2 weeks.  ?Counseled to return or seek care for continued or worsening symptoms ?Recommended condom use with all sex ? ?Patient is currently using  Depo  to prevent pregnancy.   ?- HIV [facility] ?- Chlamydia/Gonorrhea [place] ?- WET PREP FOR [place], YEAST, CLUE ?- Syphilis Serology, [place] ? ?2. Surveillance for Depo-Provera contraception ?Discussed with patient on importance of Depo every 11-13 weeks  ?Pt is interested in IUD at some point.  Pt wants to think about the IUD.  Information given.  Pt encouraged to make appointment for IUD insertion before next Depo is due.   ?- Pregnancy, urine ? ?3. Encounter for emergency contraception ?Last sex was 1 day ago. ?ECP offered and accepted by patient.  ECP instructions  given.   ?- levonorgestrel (PLAN B ONE-STEP) 1.5 MG tablet; Take 1 tablet (1.5 mg total) by mouth once for 1 dose.  Dispense: 1 tablet; Refill: 0 ? ? ?Return for as needed. ? ?No future appointments. ? ?Junious Dresser, FNP ? ?

## 2022-01-21 ENCOUNTER — Ambulatory Visit (LOCAL_COMMUNITY_HEALTH_CENTER): Payer: Medicaid Other | Admitting: Advanced Practice Midwife

## 2022-01-21 ENCOUNTER — Other Ambulatory Visit: Payer: Self-pay | Admitting: Advanced Practice Midwife

## 2022-01-21 VITALS — BP 97/71 | HR 82 | Ht 64.5 in | Wt 152.6 lb

## 2022-01-21 DIAGNOSIS — Z30013 Encounter for initial prescription of injectable contraceptive: Secondary | ICD-10-CM

## 2022-01-21 DIAGNOSIS — Z3009 Encounter for other general counseling and advice on contraception: Secondary | ICD-10-CM

## 2022-01-21 DIAGNOSIS — Z309 Encounter for contraceptive management, unspecified: Secondary | ICD-10-CM | POA: Diagnosis not present

## 2022-01-21 DIAGNOSIS — Z3202 Encounter for pregnancy test, result negative: Secondary | ICD-10-CM | POA: Diagnosis not present

## 2022-01-21 LAB — PREGNANCY, URINE: Preg Test, Ur: NEGATIVE

## 2022-01-21 MED ORDER — MEDROXYPROGESTERONE ACETATE 150 MG/ML IM SUSP
150.0000 mg | Freq: Once | INTRAMUSCULAR | Status: AC
  Administered 2022-01-21: 150 mg via INTRAMUSCULAR

## 2022-01-21 MED ORDER — LEVONORGESTREL 1.5 MG PO TABS: 1.5000 mg | ORAL_TABLET | Freq: Once | ORAL | 0 refills | Status: DC

## 2022-01-21 NOTE — Progress Notes (Addendum)
Presents to clinic for Depo with physical due 01/24/2022. Last Depo injection was 20 weeks ago. Desires to wait for Depo today and plans to call and scheduled physical when Depo due on 04/07/2022. Rich Number, RN UPT negative. Tolerated Depo injection today without complaint. Rich Number, RN

## 2022-01-21 NOTE — Progress Notes (Addendum)
[facility] Main Number: [phone]  Contraception/Family Planning VISIT ENCOUNTER NOTE  Subjective:   Beverly Dorsey is a 31 y.o. SBF nonsmoker [id number] female here for reproductive life counseling. The patient is currently using No Method - Other Reason to prevent pregnancy.    The patient does not want a pregnancy in the next year.  LMP "years ago". Last sex 01/19/22 without condom; with current partner x 14 years; 1 partner in last 3 mo. Wants DMPA today. Last DMPA 20 wks ago 09/03/21. Last PE 01/23/21. Last pap 01/23/21 neg HPV neg. Denies cigs, vaping, cigars, MJ. Last ETOH 10/2021 (2 [place]).  Client states they are looking for the following:  High efficacy at preventing pregnancy  Denies abnormal vaginal bleeding, discharge, pelvic pain, problems with intercourse or other gynecologic concerns.    Gynecologic History No LMP recorded. Patient has had an injection.  Health Maintenance Due  Topic Date Due   Hepatitis C Screening  Never done   INFLUENZA VACCINE  12/31/2021     The following portions of the patient's history were reviewed and updated as appropriate: allergies, current medications, past family history, past medical history, past social history, past surgical history and problem list.  Review of Systems Pertinent items are noted in HPI.   Objective:  BP 97/71   Pulse 82   Ht 5' 4.5" (1.638 m)   Wt 152 lb 9.6 oz (69.2 kg)   BMI 25.79 kg/m  Gen: well appearing, NAD HEENT: no scleral icterus CV: RR Lung: Normal WOB Ext: warm well perfused     Assessment and Plan:   Need for emergency contraceptive care was assessed today.  Last unprotected sex was:  01/19/22 without condom; with current partner x 14 years; 1 partner in last 3 mo.   Patient reported Unprotected sex within past 120 hours.  Reviewed options and patient desired Plan B (levonorgestrel)   Contraception counseling:  Reviewed methods in a patient centered fashion and used shared decision making with the patient. Utilized [employer] patient education tools as appropriate. The patient stated there goals and desires from a method are: High efficacy at preventing pregnancy  We reviewed the following methods in detail based on patient preferences available included: Hormonal Injection  Patient expressed they would like Hormonal Injection  This was provided to the patient today.  if not why not clearly documented  Risks, benefits, and typical effectiveness rates were reviewed.  Questions were answered.  Written information was also given to the patient to review.       will follow up in  11-13 weeks for surveillance.   was told to call with any further questions, or with any concerns about this method of contraception or cycle control.  Emphasized use of condoms 100% of the time for STI prevention.   1. Family planning Pt requesting primary care MD list - Pregnancy, urine  2. Encounter for initial prescription of injectable contraceptive If PT neg today pt desires DMPA 150 mg IM Pt desires Plan B today--e-rx'd Pt counseled to abstain/back up condoms next 7 days Pt counseled to repeat PT 02/03/22 and call if + - medroxyPROGESTERone (DEPO-PROVERA) injection 150 mg    Please refer to After Visit Summary for other counseling recommendations.   No follow-ups on file.  Herbie Saxon, [place]

## 2022-01-24 MED ORDER — LEVONORGESTREL 1.5 MG PO TABS: 1.5000 mg | ORAL_TABLET | Freq: Once | ORAL | Status: DC

## 2022-01-24 NOTE — Addendum Note (Signed)
Addended by: Donnal Moat on: 01/24/2022 08:42 AM   Modules accepted: Orders

## 2023-01-29 ENCOUNTER — Encounter: Payer: Self-pay | Admitting: Emergency Medicine

## 2023-01-29 ENCOUNTER — Other Ambulatory Visit: Payer: Self-pay

## 2023-01-29 ENCOUNTER — Emergency Department
Admission: EM | Admit: 2023-01-29 | Discharge: 2023-01-29 | Disposition: A | Discharge status: Home / Self Care | Payer: Medicaid Other | Attending: Emergency Medicine | Admitting: Emergency Medicine

## 2023-01-29 DIAGNOSIS — S01541A Puncture wound with foreign body of lip, initial encounter: Secondary | ICD-10-CM | POA: Diagnosis not present

## 2023-01-29 DIAGNOSIS — W458XXA Other foreign body or object entering through skin, initial encounter: Secondary | ICD-10-CM | POA: Diagnosis not present

## 2023-01-29 DIAGNOSIS — J45909 Unspecified asthma, uncomplicated: Secondary | ICD-10-CM | POA: Insufficient documentation

## 2023-01-29 DIAGNOSIS — S00551A Superficial foreign body of lip, initial encounter: Secondary | ICD-10-CM

## 2023-01-29 DIAGNOSIS — S0993XA Unspecified injury of face, initial encounter: Secondary | ICD-10-CM | POA: Diagnosis present

## 2023-01-29 MED ORDER — LIDOCAINE HCL (PF) 1 % IJ SOLN
5.0000 mL | Freq: Once | INTRAMUSCULAR | Status: AC
  Administered 2023-01-29: 5 mL via INTRADERMAL
  Filled 2023-01-29: qty 5

## 2023-01-29 NOTE — ED Provider Notes (Signed)
[facility] Provider Note    Event Date/Time   First MD Initiated Contact with Patient 01/29/23 0935     (approximate)   History   No chief complaint on file.   HPI  Beverly Dorsey is a 32 y.o. female with PMH of asthma and sickle cell anemia who presents to the ED because she is unable to remove her lip piercing.  Patient states that the backing of the lip piercing is stuck inside her lip which has caused it to swell up.       Physical Exam   Triage Vital Signs: ED Triage Vitals  Encounter Vitals Group     BP 01/29/23 0916 113/85     Systolic BP Percentile --      Diastolic BP Percentile --      Pulse Rate 01/29/23 0916 75     Resp 01/29/23 0916 16     Temp 01/29/23 0916 98.8 F (37.1 C)     Temp Source 01/29/23 0916 Oral     SpO2 01/29/23 0916 100 %     Weight 01/29/23 0917 152 lb 8.9 oz (69.2 kg)     Height 01/29/23 0917 5' 4.5" (1.638 m)     Head Circumference --      Peak Flow --      Pain Score 01/29/23 0916 8     Pain Loc --      Pain Education --      Exclude from Growth Chart --     Most recent vital signs: Vitals:   01/29/23 0916  BP: 113/85  Pulse: 75  Resp: 16  Temp: 98.8 F (37.1 C)  SpO2: 100%    General: Awake, no distress.  CV:  Good peripheral perfusion.  Resp:  Normal effort.  Abd:  No distention.  Other:  Piercing visible on the exterior middle upper lip, backing of the piercing is not visible on the inside of the lip and there is an area that has healed over, a hard nodule can be felt within the lip at the site of the piercing, there is some purulent drainage surrounding the piercing on the exterior lip, localized swelling to the area.    ED Results / Procedures / Treatments   Labs (all labs ordered are listed, but only abnormal results are displayed) Labs Reviewed - No data to display   PROCEDURES:  Critical Care performed: No  .Foreign Body Removal  Date/Time: 01/29/2023 12:06 PM  Performed  by: Cameron Ali, PA-C Authorized by: Cameron Ali, PA-C  Consent: Verbal consent obtained. Consent given by: patient Patient understanding: patient states understanding of the procedure being performed Patient identity confirmed: verbally with patient Body area: mucosa General location: head/neck Location details: face Anesthesia: local infiltration  Anesthesia: Local Anesthetic: lidocaine 1% without epinephrine Anesthetic total: 3 mL  Sedation: Patient sedated: no  Patient restrained: no Patient cooperative: yes Localization method: probed Removal mechanism: forceps Tendon involvement: none Depth: deep Complexity: simple 1 objects recovered. Objects recovered: Lip piercing Post-procedure assessment: foreign body removed Patient tolerance: patient tolerated the procedure well with no immediate complications  .Marland KitchenLaceration Repair  Date/Time: 01/29/2023 12:11 PM  Performed by: Cameron Ali, PA-C Authorized by: Cameron Ali, PA-C   Consent:    Consent obtained:  Verbal   Consent given by:  Patient   Risks, benefits, and alternatives were discussed: yes     Risks discussed:  Infection, pain, poor cosmetic result and poor wound healing  Alternatives discussed:  No treatment Universal protocol:    Patient identity confirmed:  Verbally with patient Anesthesia:    Anesthesia method:  Local infiltration   Local anesthetic:  Lidocaine 1% w/o epi Laceration details:    Location:  Lip   Lip location:  Upper interior lip   Length (cm):  1   Depth (mm):  10 Treatment:    Irrigation solution:  Tap water   Irrigation method: Patient swished and spit with tap water. Skin repair:    Repair method:  Sutures   Suture size:  6-0   Suture material:  Fast-absorbing gut   Suture technique:  Simple interrupted   Number of sutures:  3 Approximation:    Approximation:  Close Repair type:    Repair type:  Simple Post-procedure details:    Dressing:   Open (no dressing)   Procedure completion:  Tolerated well, no immediate complications    MEDICATIONS ORDERED IN ED: Medications  lidocaine (PF) (XYLOCAINE) 1 % injection 5 mL (5 mLs Intradermal Given by Other 01/29/23 1111)     IMPRESSION / MDM / ASSESSMENT AND PLAN / ED COURSE  I reviewed the triage vital signs and the nursing notes.                             32 year old female presents to the ED because she was unable to get her lip piercing out of her lip.  Vital signs stable and patient and NAD on exam.   Differential diagnosis includes, but is not limited to, foreign body of the lip, infected piercing.  Patient's presentation is most consistent with acute, uncomplicated illness.  Patient was given local anesthetic to her lip, and made a small incision and used forceps to remove the piercing.  Incision was then closed with 6-0 Vicryl sutures.  See procedure notes for further details.  Patient instructed to apply triple antibiotic ointment over the exterior piercing hole and wait until fully healed before piercing again.  Patient voiced understanding, all questions were answered and she was stable at discharge.    FINAL CLINICAL IMPRESSION(S) / ED DIAGNOSES   Final diagnoses:  Foreign body in skin of lip     Rx / DC Orders   ED Discharge Orders     None        Note:  This document was prepared using Dragon voice recognition software and may include unintentional dictation errors.   Cameron Ali, PA-C 01/29/23 1218    Dionne Bucy, MD 01/30/23 1140

## 2023-01-29 NOTE — ED Triage Notes (Signed)
Arrives with lip piercing done 2.5 months ago.  The earrings hoop is stuck into lip.

## 2023-01-29 NOTE — Discharge Instructions (Addendum)
Please apply bacitracin ointment or triple antibiotic ointment over the piercing site externally. Watch for signs of infection including warmth, redness, swelling and purulent discharge.  Please wait for the area to fully heal before piercing again.

## 2023-06-17 ENCOUNTER — Encounter: Payer: Self-pay | Admitting: Family Medicine

## 2023-06-17 ENCOUNTER — Ambulatory Visit: Payer: Medicaid Other | Admitting: Family Medicine

## 2023-06-17 VITALS — BP 104/61 | HR 84 | Ht 64.49 in | Wt 152.8 lb

## 2023-06-17 DIAGNOSIS — Z3042 Encounter for surveillance of injectable contraceptive: Secondary | ICD-10-CM

## 2023-06-17 DIAGNOSIS — Z6825 Body mass index (BMI) 25.0-25.9, adult: Secondary | ICD-10-CM

## 2023-06-17 DIAGNOSIS — Z3009 Encounter for other general counseling and advice on contraception: Secondary | ICD-10-CM | POA: Diagnosis not present

## 2023-06-17 DIAGNOSIS — Z113 Encounter for screening for infections with a predominantly sexual mode of transmission: Secondary | ICD-10-CM

## 2023-06-17 DIAGNOSIS — Z131 Encounter for screening for diabetes mellitus: Secondary | ICD-10-CM

## 2023-06-17 LAB — WET PREP FOR TRICH, YEAST, CLUE
Trichomonas Exam: NEGATIVE
Yeast Exam: NEGATIVE

## 2023-06-17 LAB — HM HIV SCREENING LAB: HM HIV Screening: NEGATIVE

## 2023-06-17 NOTE — Progress Notes (Signed)
Pt is here for PE and STD testing.  Wet mount results reviewed, no treatment required.  FP packet given.  Berdie Ogren, RN
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
## 2023-06-17 NOTE — Patient Instructions (Signed)
 STI screening - Today we obtained a vaginal swab to screen for gonorrhea, chlamydia, and trichomonas - We also obtained a blood sample to screen for HIV and syphilis - If the results are normal, I will send you a letter or MyChart message. If the results are abnormal, I will give you a call.    Estimated time frame for results collected at the [facility] Department: Same day Trichomonas Yeast BV (bacterial vaginosis)  Within 2 weeks Gonorrhea Chlamydia  Within 3-4 weeks HIV Syphilis Hepatitis B Hepatitis C

## 2023-06-18 LAB — HGB A1C W/O EAG: Hgb A1c MFr Bld: <4.2 % — ABNORMAL LOW (ref 4.8–5.6)

## 2023-06-19 ENCOUNTER — Telehealth: Payer: Self-pay | Admitting: Family Medicine

## 2023-06-19 NOTE — Telephone Encounter (Signed)
Called patient regarding A1c being oddly low. A1c of 4.2 corresponds to average blood glucose of 74; her A1c was listed as <4.2.   No answer, left HIPAA safe VM. I recommended in that VM that she see her PCP or establish with a primary care clinic for further evaluation. Would want to ensure normal liver function and look for metabolic disease.   Fayette Pho, MD 06/19/23  1:39 PM

## 2023-06-24 LAB — GONOCOCCUS CULTURE

## 2023-12-15 ENCOUNTER — Encounter: Payer: Self-pay | Admitting: Family Medicine

## 2023-12-15 ENCOUNTER — Ambulatory Visit: Admitting: Family Medicine

## 2023-12-15 DIAGNOSIS — Z113 Encounter for screening for infections with a predominantly sexual mode of transmission: Secondary | ICD-10-CM

## 2023-12-15 DIAGNOSIS — Z62819 Personal history of unspecified abuse in childhood: Secondary | ICD-10-CM | POA: Insufficient documentation

## 2023-12-15 LAB — WET PREP FOR TRICH, YEAST, CLUE
Clue Cell Exam: NEGATIVE
Trichomonas Exam: NEGATIVE
Yeast Exam: NEGATIVE

## 2023-12-15 NOTE — Progress Notes (Signed)
 Pt is here for std screening, wet prep results reviewed with pt no treatment required per standing order.  Caren Channel, RN
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
## 2024-03-04 ENCOUNTER — Ambulatory Visit
Admission: EM | Admit: 2024-03-04 | Discharge: 2024-03-04 | Disposition: A | Discharge status: Home / Self Care | Attending: Family Medicine | Admitting: Family Medicine

## 2024-03-04 ENCOUNTER — Ambulatory Visit: Payer: Self-pay | Admitting: Family Medicine

## 2024-03-04 ENCOUNTER — Ambulatory Visit
Admission: RE | Admit: 2024-03-04 | Discharge: 2024-03-04 | Disposition: A | Discharge status: Home / Self Care | Source: Ambulatory Visit | Attending: Family Medicine | Admitting: Family Medicine

## 2024-03-04 DIAGNOSIS — M79605 Pain in left leg: Secondary | ICD-10-CM | POA: Insufficient documentation

## 2024-03-04 DIAGNOSIS — D582 Other hemoglobinopathies: Secondary | ICD-10-CM | POA: Diagnosis present

## 2024-03-04 DIAGNOSIS — D509 Iron deficiency anemia, unspecified: Secondary | ICD-10-CM | POA: Insufficient documentation

## 2024-03-04 DIAGNOSIS — S8012XA Contusion of left lower leg, initial encounter: Secondary | ICD-10-CM | POA: Insufficient documentation

## 2024-03-04 LAB — COMPREHENSIVE METABOLIC PANEL WITH GFR
ALT: 11 U/L (ref 0–44)
AST: 16 U/L (ref 15–41)
Albumin: 4.3 g/dL (ref 3.5–5.0)
Alkaline Phosphatase: 39 U/L (ref 38–126)
Anion gap: 8 (ref 5–15)
BUN: 10 mg/dL (ref 6–20)
CO2: 24 mmol/L (ref 22–32)
Calcium: 8.9 mg/dL (ref 8.9–10.3)
Chloride: 104 mmol/L (ref 98–111)
Creatinine, Ser: 0.8 mg/dL (ref 0.44–1.00)
GFR, Estimated: >60 mL/min (ref >60)
Glucose, Bld: 96 mg/dL (ref 70–99)
Potassium: 3.8 mmol/L (ref 3.5–5.1)
Sodium: 136 mmol/L (ref 135–145)
Total Bilirubin: 1.4 mg/dL — ABNORMAL HIGH (ref 0.0–1.2)
Total Protein: 7.8 g/dL (ref 6.5–8.1)

## 2024-03-04 LAB — CBC WITH DIFFERENTIAL/PLATELET
Abs Immature Granulocytes: 0.01 K/uL (ref 0.00–0.07)
Basophils Absolute: 0 K/uL (ref 0.0–0.1)
Basophils Relative: 1 %
Eosinophils Absolute: 0.1 K/uL (ref 0.0–0.5)
Eosinophils Relative: 2 %
HCT: 27.7 % — ABNORMAL LOW (ref 36.0–46.0)
Hemoglobin: 10.1 g/dL — ABNORMAL LOW (ref 12.0–15.0)
Immature Granulocytes: 0 %
Lymphocytes Relative: 46 %
Lymphs Abs: 2.5 K/uL (ref 0.7–4.0)
MCH: 25.8 pg — ABNORMAL LOW (ref 26.0–34.0)
MCHC: 36.5 g/dL — ABNORMAL HIGH (ref 30.0–36.0)
MCV: 70.8 fL — ABNORMAL LOW (ref 80.0–100.0)
Monocytes Absolute: 0.6 K/uL (ref 0.1–1.0)
Monocytes Relative: 10 %
Neutro Abs: 2.3 K/uL (ref 1.7–7.7)
Neutrophils Relative %: 41 %
Platelets: 204 K/uL (ref 150–400)
RBC: 3.91 MIL/uL (ref 3.87–5.11)
RDW: 14.6 % (ref 11.5–15.5)
WBC: 5.5 K/uL (ref 4.0–10.5)
nRBC: 1.7 % — ABNORMAL HIGH (ref 0.0–0.2)

## 2024-03-04 NOTE — Discharge Instructions (Addendum)
 Go to the medical mall to have an ultrasound performed. Look for signs at [facility] for the [facility]. Enter through the medical mall and go to the radiology department for your ultrasound.   I will call you to discuss your ultrasound results and lab work, later today.  Consider downloading MyChart to see your results.
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
## 2024-03-04 NOTE — ED Triage Notes (Signed)
 Pt c/o painful bruising x1day  Pt states that she has varicose veins and usually has pain in her legs.   Pt states that she normally gets bruises along her legs 3 times a month and they go away within a week  Pt states that she is on her feet a lot and denies physical activity or exercise  Pt states that her leg itches along the bruise.

## 2024-03-21 ENCOUNTER — Telehealth: Payer: Self-pay | Admitting: Family Medicine

## 2024-03-21 ENCOUNTER — Ambulatory Visit (INDEPENDENT_AMBULATORY_CARE_PROVIDER_SITE_OTHER): Admitting: Family Medicine

## 2024-03-21 VITALS — BP 109/76 | HR 76 | Temp 98.2°F | Resp 18 | Ht 64.49 in | Wt 154.0 lb

## 2024-03-21 DIAGNOSIS — D582 Other hemoglobinopathies: Secondary | ICD-10-CM

## 2024-03-21 DIAGNOSIS — R5383 Other fatigue: Secondary | ICD-10-CM | POA: Diagnosis not present

## 2024-03-21 NOTE — Assessment & Plan Note (Signed)
 Will check her hemoglobin to see her level of anemia.  Also checking iron indices because she could have iron deficiency anemia as well.

## 2024-03-21 NOTE — Telephone Encounter (Signed)
 Called and LM that I wanted to talk to her about depression.  Will try her again later or she can call here.
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
## 2024-03-22 ENCOUNTER — Ambulatory Visit: Payer: Self-pay | Admitting: Family Medicine

## 2024-03-22 LAB — CBC WITH DIFFERENTIAL/PLATELET
Basophils Absolute: 0 x10E3/uL (ref 0.0–0.2)
Basos: 1 %
EOS (ABSOLUTE): 0.1 x10E3/uL (ref 0.0–0.4)
Eos: 2 %
Hematocrit: 31.6 % — ABNORMAL LOW (ref 34.0–46.6)
Hemoglobin: 10.4 g/dL — ABNORMAL LOW (ref 11.1–15.9)
Immature Grans (Abs): 0 x10E3/uL (ref 0.0–0.1)
Immature Granulocytes: 0 %
Lymphocytes Absolute: 2.9 x10E3/uL (ref 0.7–3.1)
Lymphs: 45 %
MCH: 26.1 pg — ABNORMAL LOW (ref 26.6–33.0)
MCHC: 32.9 g/dL (ref 31.5–35.7)
MCV: 79 fL (ref 79–97)
Monocytes Absolute: 0.5 x10E3/uL (ref 0.1–0.9)
Monocytes: 8 %
NRBC: 1 % — ABNORMAL HIGH (ref 0–0)
Neutrophils Absolute: 2.8 x10E3/uL (ref 1.4–7.0)
Neutrophils: 44 %
Platelets: 254 x10E3/uL (ref 150–450)
RBC: 3.99 x10E6/uL (ref 3.77–5.28)
RDW: 14.4 % (ref 11.7–15.4)
WBC: 6.3 x10E3/uL (ref 3.4–10.8)

## 2024-03-22 LAB — COMPREHENSIVE METABOLIC PANEL WITH GFR
ALT: 9 IU/L (ref 0–32)
AST: 14 IU/L (ref 0–40)
Albumin: 4.3 g/dL (ref 3.9–4.9)
Alkaline Phosphatase: 54 IU/L (ref 41–116)
BUN/Creatinine Ratio: 6 — ABNORMAL LOW (ref 9–23)
BUN: 5 mg/dL — ABNORMAL LOW (ref 6–20)
Bilirubin Total: 1 mg/dL (ref 0.0–1.2)
CO2: 22 mmol/L (ref 20–29)
Calcium: 8.9 mg/dL (ref 8.7–10.2)
Chloride: 103 mmol/L (ref 96–106)
Creatinine, Ser: 0.78 mg/dL (ref 0.57–1.00)
Globulin, Total: 2.8 g/dL (ref 1.5–4.5)
Glucose: 87 mg/dL (ref 70–99)
Potassium: 4.2 mmol/L (ref 3.5–5.2)
Sodium: 136 mmol/L (ref 134–144)
Total Protein: 7.1 g/dL (ref 6.0–8.5)
eGFR: 102 mL/min/1.73 (ref >59)

## 2024-03-22 LAB — IRON,TIBC AND FERRITIN PANEL
Ferritin: 355 ng/mL — ABNORMAL HIGH (ref 15–150)
Iron Saturation: 42 % (ref 15–55)
Iron: 95 ug/dL (ref 27–159)
Total Iron Binding Capacity: 228 ug/dL — ABNORMAL LOW (ref 250–450)
UIBC: 133 ug/dL (ref 131–425)

## 2024-03-22 LAB — TSH: TSH: 2.1 u[IU]/mL (ref 0.450–4.500)

## 2024-03-22 LAB — T4, FREE: Free T4: 0.9 ng/dL (ref 0.82–1.77)
